# Patient Record
Sex: Male | Born: 1935 | Race: White | Hispanic: No | Marital: Married
Health system: Southern US, Community
[De-identification: ages and names within clinical notes are randomized; demographics above are authoritative.]

---

## 2006-08-19 ENCOUNTER — Ambulatory Visit: Payer: Self-pay | Admitting: Urology

## 2007-05-06 ENCOUNTER — Ambulatory Visit: Payer: Self-pay | Admitting: Surgery

## 2007-05-06 ENCOUNTER — Other Ambulatory Visit: Payer: Self-pay

## 2007-05-10 ENCOUNTER — Ambulatory Visit: Payer: Self-pay | Admitting: Surgery

## 2007-08-17 ENCOUNTER — Ambulatory Visit: Payer: Self-pay | Admitting: Urology

## 2009-04-04 ENCOUNTER — Encounter: Admission: RE | Admit: 2009-04-04 | Discharge: 2009-04-04 | Payer: Self-pay | Admitting: Family Medicine

## 2009-08-10 ENCOUNTER — Inpatient Hospital Stay (HOSPITAL_COMMUNITY): Admission: RE | Admit: 2009-08-10 | Discharge: 2009-08-14 | Payer: Self-pay | Admitting: Orthopaedic Surgery

## 2010-02-24 ENCOUNTER — Encounter: Payer: Self-pay | Admitting: Family Medicine

## 2010-04-21 LAB — GLUCOSE, CAPILLARY
Glucose-Capillary: 107 mg/dL — ABNORMAL HIGH (ref 70–99)
Glucose-Capillary: 118 mg/dL — ABNORMAL HIGH (ref 70–99)
Glucose-Capillary: 119 mg/dL — ABNORMAL HIGH (ref 70–99)
Glucose-Capillary: 133 mg/dL — ABNORMAL HIGH (ref 70–99)
Glucose-Capillary: 134 mg/dL — ABNORMAL HIGH (ref 70–99)
Glucose-Capillary: 138 mg/dL — ABNORMAL HIGH (ref 70–99)
Glucose-Capillary: 145 mg/dL — ABNORMAL HIGH (ref 70–99)
Glucose-Capillary: 148 mg/dL — ABNORMAL HIGH (ref 70–99)

## 2010-04-21 LAB — CBC
HCT: 24.6 % — ABNORMAL LOW (ref 39.0–52.0)
HCT: 26.3 % — ABNORMAL LOW (ref 39.0–52.0)
HCT: 30.8 % — ABNORMAL LOW (ref 39.0–52.0)
HCT: 40.7 % (ref 39.0–52.0)
Hemoglobin: 10.7 g/dL — ABNORMAL LOW (ref 13.0–17.0)
Hemoglobin: 8.6 g/dL — ABNORMAL LOW (ref 13.0–17.0)
Hemoglobin: 9.5 g/dL — ABNORMAL LOW (ref 13.0–17.0)
MCH: 31.3 pg (ref 26.0–34.0)
MCH: 31.4 pg (ref 26.0–34.0)
MCH: 32.3 pg (ref 26.0–34.0)
MCHC: 34.8 g/dL (ref 30.0–36.0)
MCHC: 35.4 g/dL (ref 30.0–36.0)
MCHC: 36 g/dL (ref 30.0–36.0)
MCV: 89.6 fL (ref 78.0–100.0)
MCV: 90.1 fL (ref 78.0–100.0)
MCV: 91.5 fL (ref 78.0–100.0)
Platelets: 102 10*3/uL — ABNORMAL LOW (ref 150–400)
Platelets: 120 10*3/uL — ABNORMAL LOW (ref 150–400)
Platelets: 94 10*3/uL — ABNORMAL LOW (ref 150–400)
RBC: 2.73 MIL/uL — ABNORMAL LOW (ref 4.22–5.81)
RBC: 3.41 MIL/uL — ABNORMAL LOW (ref 4.22–5.81)
RBC: 4.45 MIL/uL (ref 4.22–5.81)
RDW: 12.7 % (ref 11.5–15.5)
RDW: 13.9 % (ref 11.5–15.5)
WBC: 7.9 10*3/uL (ref 4.0–10.5)
WBC: 8.6 10*3/uL (ref 4.0–10.5)

## 2010-04-21 LAB — BASIC METABOLIC PANEL
BUN: 20 mg/dL (ref 6–23)
CO2: 27 mEq/L (ref 19–32)
CO2: 29 mEq/L (ref 19–32)
Calcium: 8 mg/dL — ABNORMAL LOW (ref 8.4–10.5)
Chloride: 106 mEq/L (ref 96–112)
Chloride: 108 mEq/L (ref 96–112)
Creatinine, Ser: 1.15 mg/dL (ref 0.4–1.5)
Creatinine, Ser: 1.23 mg/dL (ref 0.4–1.5)
GFR calc Af Amer: >60 mL/min (ref >60)
GFR calc Af Amer: >60 mL/min (ref >60)
GFR calc non Af Amer: >60 mL/min (ref >60)
Glucose, Bld: 133 mg/dL — ABNORMAL HIGH (ref 70–99)
Glucose, Bld: 137 mg/dL — ABNORMAL HIGH (ref 70–99)
Potassium: 3.6 mEq/L (ref 3.5–5.1)
Potassium: 3.9 mEq/L (ref 3.5–5.1)
Potassium: 4.3 mEq/L (ref 3.5–5.1)
Sodium: 138 mEq/L (ref 135–145)
Sodium: 141 mEq/L (ref 135–145)

## 2010-04-21 LAB — CROSSMATCH
ABO/RH(D): A POS
Antibody Screen: NEGATIVE

## 2010-04-21 LAB — PROTIME-INR
INR: 0.98 (ref 0.00–1.49)
INR: 1.12 (ref 0.00–1.49)
INR: 1.33 (ref 0.00–1.49)
Prothrombin Time: 12.9 seconds (ref 11.6–15.2)
Prothrombin Time: 14.3 seconds (ref 11.6–15.2)
Prothrombin Time: 16.4 seconds — ABNORMAL HIGH (ref 11.6–15.2)

## 2010-04-21 LAB — TYPE AND SCREEN: ABO/RH(D): A POS

## 2010-04-21 LAB — COMPREHENSIVE METABOLIC PANEL
Albumin: 3.7 g/dL (ref 3.5–5.2)
Alkaline Phosphatase: 43 U/L (ref 39–117)
BUN: 28 mg/dL — ABNORMAL HIGH (ref 6–23)
CO2: 26 mEq/L (ref 19–32)
Chloride: 110 mEq/L (ref 96–112)
Creatinine, Ser: 1.31 mg/dL (ref 0.4–1.5)
GFR calc non Af Amer: 52 mL/min — ABNORMAL LOW (ref >60)
Glucose, Bld: 139 mg/dL — ABNORMAL HIGH (ref 70–99)
Total Bilirubin: 0.7 mg/dL (ref 0.3–1.2)

## 2011-03-24 ENCOUNTER — Emergency Department: Payer: Self-pay | Admitting: Emergency Medicine

## 2011-03-24 LAB — BASIC METABOLIC PANEL
Anion Gap: 12 (ref 7–16)
Calcium, Total: 8.6 mg/dL (ref 8.5–10.1)
Chloride: 104 mmol/L (ref 98–107)
EGFR (Non-African Amer.): 56 — ABNORMAL LOW
Osmolality: 292 (ref 275–301)
Potassium: 4.1 mmol/L (ref 3.5–5.1)

## 2011-03-24 LAB — CBC
HCT: 46.2 % (ref 40.0–52.0)
HGB: 15.5 g/dL (ref 13.0–18.0)
MCH: 29.2 pg (ref 26.0–34.0)
MCHC: 33.6 g/dL (ref 32.0–36.0)
RBC: 5.32 10*6/uL (ref 4.40–5.90)

## 2011-05-02 ENCOUNTER — Emergency Department: Payer: Self-pay | Admitting: Emergency Medicine

## 2011-05-02 LAB — URINALYSIS, COMPLETE
Bacteria: NONE SEEN
Bilirubin,UR: NEGATIVE
Blood: NEGATIVE
Glucose,UR: NEGATIVE mg/dL
Ketone: NEGATIVE
Leukocyte Esterase: NEGATIVE
Nitrite: NEGATIVE
Ph: 7
Protein: 30
RBC,UR: 7 /HPF
Specific Gravity: 1.016
Squamous Epithelial: <1
WBC UR: <1 /HPF

## 2011-05-02 LAB — PROTIME-INR
INR: 0.9
Prothrombin Time: 12.9 s

## 2011-05-02 LAB — CBC
HCT: 45.1 %
HGB: 15 g/dL
MCH: 28.9 pg
MCHC: 33.2 g/dL
MCV: 87 fL
Platelet: 124 x10 3/mm 3 — ABNORMAL LOW
RBC: 5.17 x10 6/mm 3
RDW: 14.4 %
WBC: 9.1 x10 3/mm 3

## 2011-05-02 LAB — COMPREHENSIVE METABOLIC PANEL
Albumin: 3.8 g/dL (ref 3.4–5.0)
Bilirubin,Total: 0.8 mg/dL (ref 0.2–1.0)
Calcium, Total: 8.8 mg/dL (ref 8.5–10.1)
Creatinine: 1.2 mg/dL (ref 0.60–1.30)
EGFR (African American): >60
Glucose: 143 mg/dL — ABNORMAL HIGH (ref 65–99)
SGPT (ALT): 23 U/L

## 2011-07-06 ENCOUNTER — Observation Stay: Payer: Self-pay | Admitting: Internal Medicine

## 2011-07-06 LAB — DIFFERENTIAL
Eosinophil #: 0.1 10*3/uL (ref 0.0–0.7)
Lymphocyte #: 2 10*3/uL (ref 1.0–3.6)
Neutrophil #: 5.5 10*3/uL (ref 1.4–6.5)
Neutrophil %: 63.1 %

## 2011-07-06 LAB — TROPONIN I: Troponin-I: <0.02 ng/mL

## 2011-07-06 LAB — TSH: Thyroid Stimulating Horm: 0.081 u[IU]/mL — ABNORMAL LOW

## 2011-07-06 LAB — COMPREHENSIVE METABOLIC PANEL
Albumin: 3.1 g/dL — ABNORMAL LOW (ref 3.4–5.0)
BUN: 16 mg/dL (ref 7–18)
Chloride: 110 mmol/L — ABNORMAL HIGH (ref 98–107)
Creatinine: 1.1 mg/dL (ref 0.60–1.30)
SGOT(AST): 18 U/L (ref 15–37)
Total Protein: 7.1 g/dL (ref 6.4–8.2)

## 2011-07-07 LAB — CBC WITH DIFFERENTIAL/PLATELET
Basophil #: 0 10*3/uL (ref 0.0–0.1)
Eosinophil %: 1.6 %
HCT: 33.5 % — ABNORMAL LOW (ref 40.0–52.0)
HGB: 11.4 g/dL — ABNORMAL LOW (ref 13.0–18.0)
Lymphocyte %: 39 %
MCH: 29.3 pg (ref 26.0–34.0)
MCV: 86 fL (ref 80–100)
Monocyte %: 14 %
RBC: 3.89 10*6/uL — ABNORMAL LOW (ref 4.40–5.90)

## 2011-07-07 LAB — BASIC METABOLIC PANEL
Chloride: 110 mmol/L — ABNORMAL HIGH (ref 98–107)
Co2: 23 mmol/L (ref 21–32)
Creatinine: 1.04 mg/dL (ref 0.60–1.30)
EGFR (African American): >60

## 2011-07-07 LAB — TROPONIN I: Troponin-I: <0.02 ng/mL

## 2011-08-04 DEATH — deceased

## 2012-12-04 IMAGING — CT CT HEAD WITHOUT CONTRAST
2 series · 15 of 30 positions shown, 19 images · non-contrast
Comparison: none

REASON FOR EXAM: ams
COMMENTS:

PROCEDURE:     CT  - CT HEAD WITHOUT CONTRAST  - July 06, 2011  [DATE]
RESULT:     Comparison is made to a prior study dated 05/02/2011.
TECHNIQUE: Helical noncontrast 5 mm sections were obtained from the skull
base to the vertex.

[Series 2: without · axial · non-contrast · 0.44mm/px · z∈[+186,+306]mm · 13 of 29 slices shown, 17 images]
[im 3/29  brain]
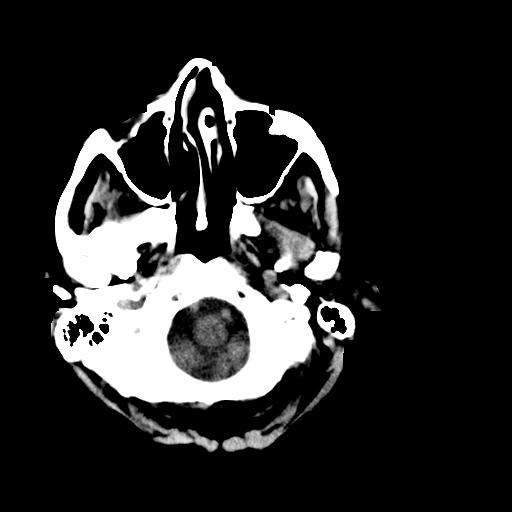
[im 3/29  bone]
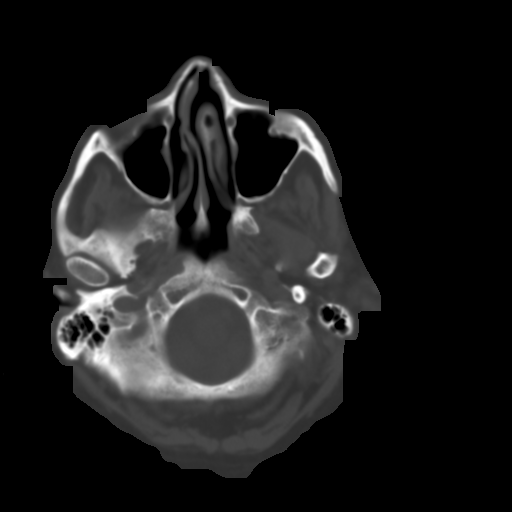
[im 5/29  brain]
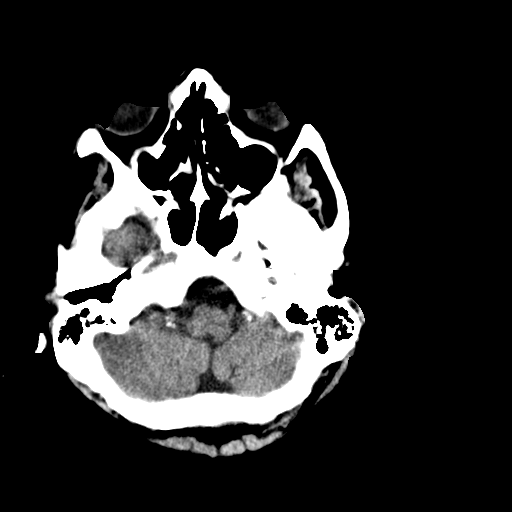
[im 7/29  brain]
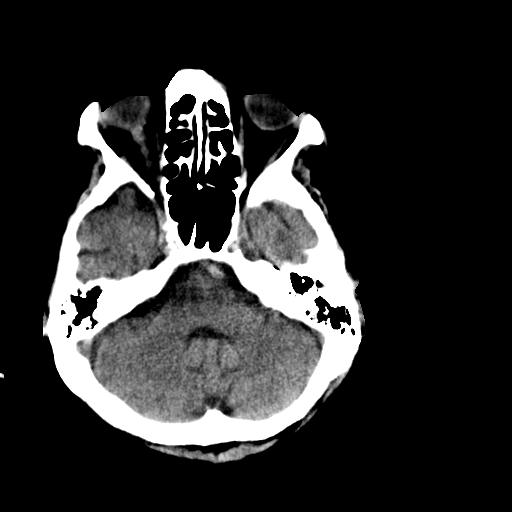
[im 9/29  brain]
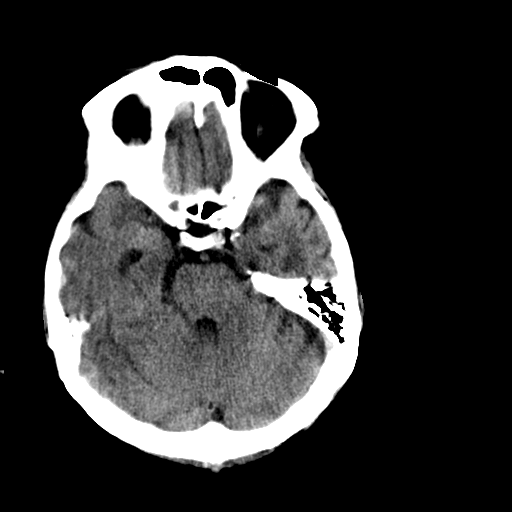
[im 11/29  brain]
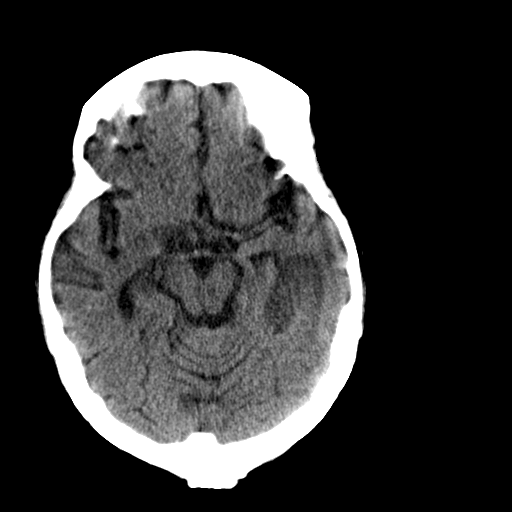
[im 11/29  bone]
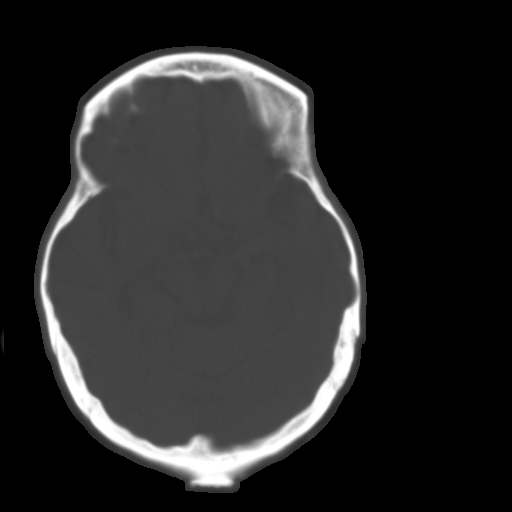
[im 13/29  brain]
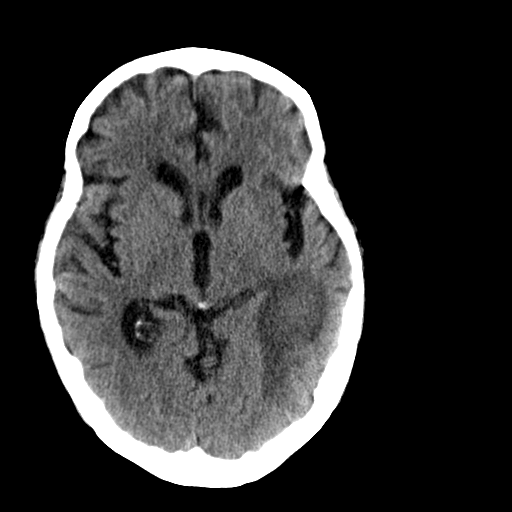
[im 15/29  brain]
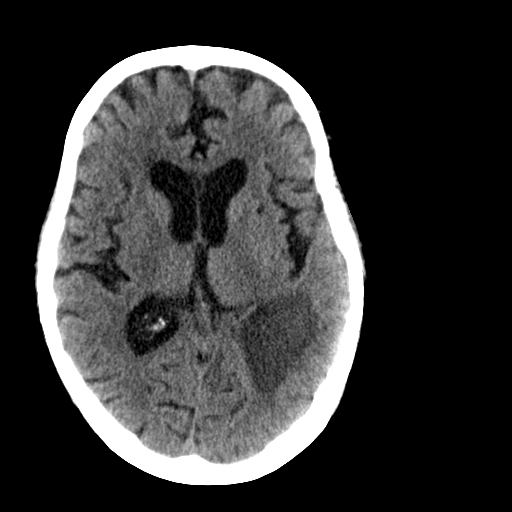
[im 17/29  brain]
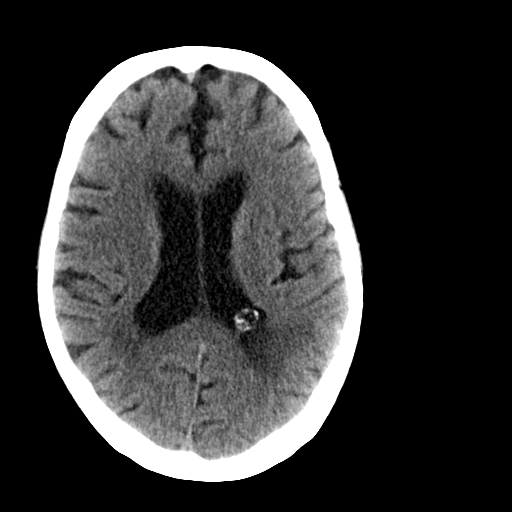
[im 19/29  brain]
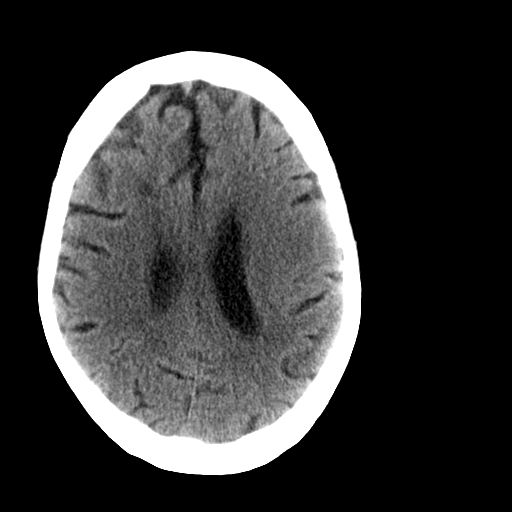
[im 19/29  bone]
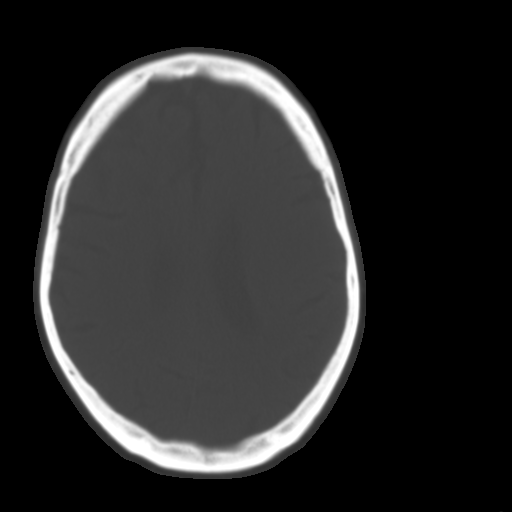
[im 21/29  brain]
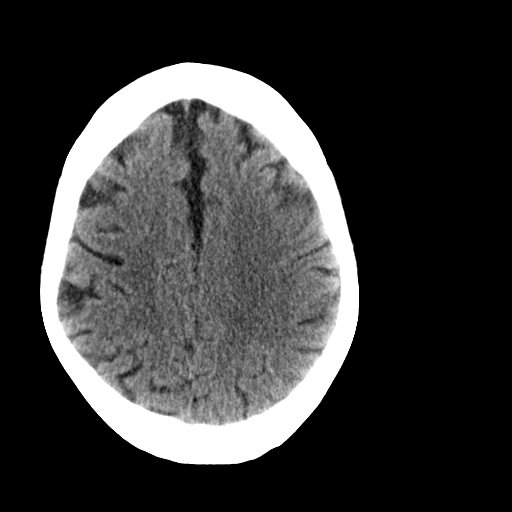
[im 23/29  brain]
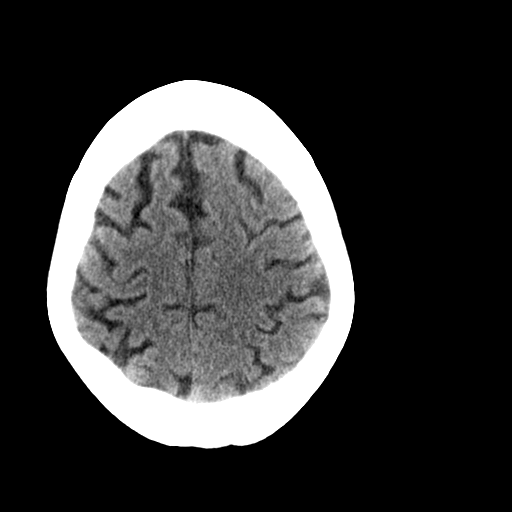
[im 25/29  brain]
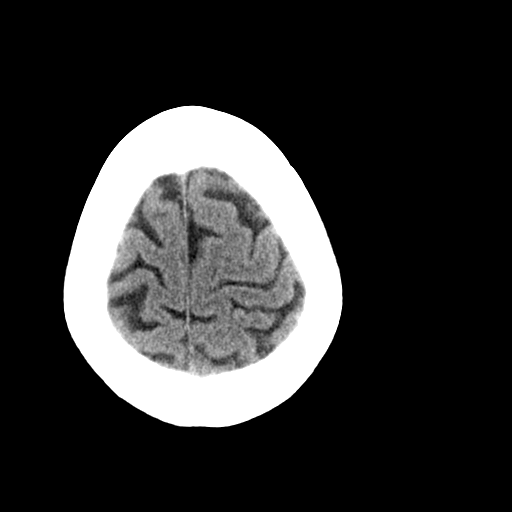
[im 27/29  brain]
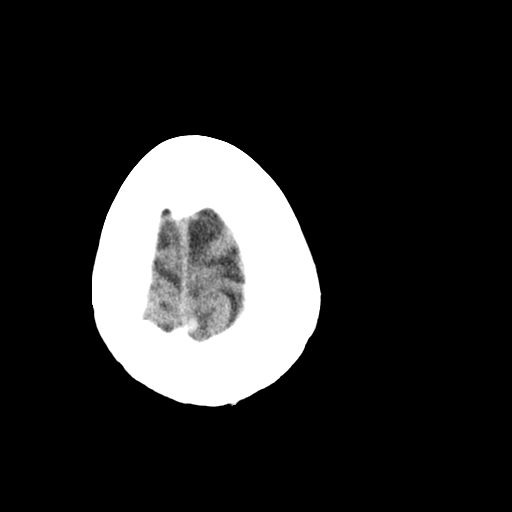
[im 27/29  bone]
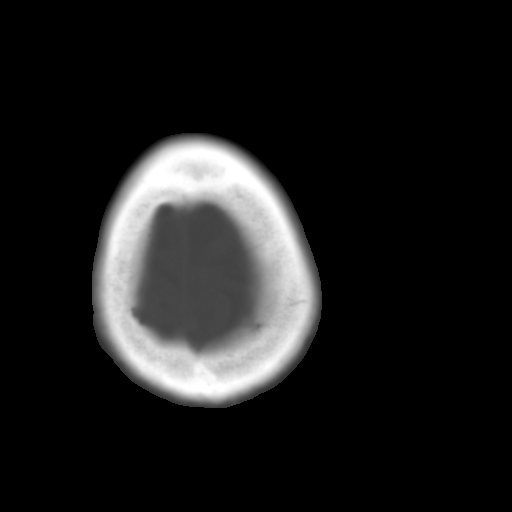

[Series 3: bone · axial · 0.44mm/px · z∈[+186,+206]mm · 2 of 29 slices shown]
[im 3/29  bone]
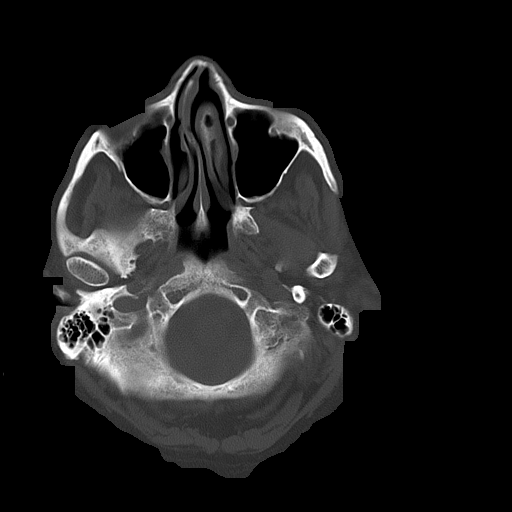
[im 7/29  bone]
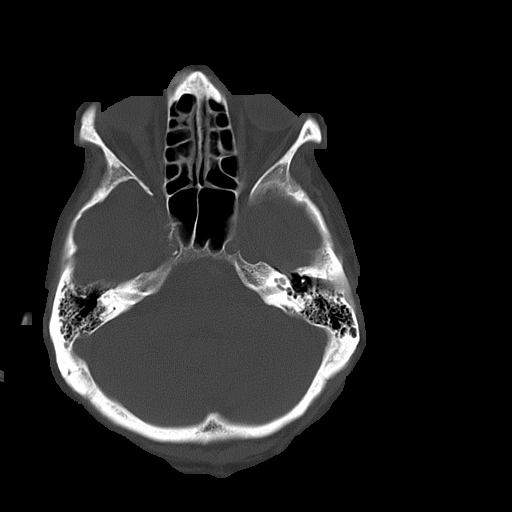

[15 of 30 positions shown; findings below may reference images not displayed]

FINDINGS: A focal area of oval-shaped, low attenuation projects within the
left temporal-occipital region. This is in an area of prior intraparenchymal
hemorrhage and has the appearance of a hematoma. No further intra-axial or
extra-axial fluid collections are identified or further evidence of acute
hemorrhage. There is no evidence of mass effect. The ventricles and cisterns
are patent. There is diffuse cortical atrophy as well as diffuse areas of
low attenuation within the subcortical, deep, and periventricular white
matter regions. There is no evidence of a depressed skull fracture. The
paranasal sinuses, visualized portions, and mastoid air cells are patent.
IMPRESSION: 1.  Findings consistent with an involuting intraparenchymal hemorrhage.
There is no evidence of acute abnormalities.
2.  Involutional and chronic changes as described above.

## 2014-05-28 NOTE — Discharge Summary (Signed)
PATIENT NAME:  Clinton Stevens, Clinton Stevens MR#:  [id number] DATE OF BIRTH:  August 17, 1922  DATE OF ADMISSION:  07/06/2011 DATE OF DISCHARGE:  07/07/2011  DISCHARGE DIAGNOSIS: Adult failure to thrive with worsening generalized weakness.   SECONDARY DIAGNOSES:  1. History of intracranial hemorrhage.  2. Hypertension. 3. Hyperlipidemia.  4. Diabetes mellitus. 5. Hypothyroidism.  6. Gout.  7. Dementia.  8. Kidney stones.   CONSULTANTS:  1. Speech therapy.  2. Physical therapy.  3. Care Management for hospice screening.   PROCEDURES/RADIOLOGY: CT scan of the head without contrast on 07/06/2011 showed involutional and chronic changes. No evidence of acute abnormalities.   KUB on 07/06/2011 showed nonobstructive bowel gas pattern.   Chest x-ray on 07/06/2011 showed no acute cardiopulmonary disease.   LABORATORY PANEL: Serum prealbumin was low with a value of 15.   HISTORY AND SHORT HOSPITAL COURSE: The patient is an 81 year old male with the above-mentioned medical problems who was admitted for altered mental status thought to be metabolic encephalopathy, likely multifactorial. He was also found to have adult failure to thrive. Please see Dr. Belva Chimesima Vaickute's dictated history and physical for further details. He was having real difficulty getting around and family could not take care of him for which hospice home screening was placed and he was accepted for hospice home as family was also in agreement and a bed was available. He was transferred to the hospice home today. He had negative two sets of cardiac enzymes and was discharged in fair condition.   DISCHARGE PHYSICAL EXAMINATION:   VITAL SIGNS: On the date of discharge, his temperature was 97.7, heart rate 121 per minute, respirations 18 per minute, blood pressure 121/76 mmHg, and he was saturating 96% on room air.   HEART: S1 and S2 normal. No murmurs, rubs, or gallops.   LUNGS: Clear to auscultation bilaterally. No wheezing, rales, rhonchi, or  crepitation.   ABDOMEN: Soft, benign.   NEUROLOGIC: He had significant weakness all over his body. He was quite lethargic and confused.   All other physical examination remained at baseline.   DISCHARGE MEDICATIONS:  1. Roxanol 0.25 to 0.5 mL p.o./sublingual every 1 to 2 hours as needed. 2. Lorazepam 0.5 mg p.o./sublingual every 2 to 4 hours as needed.  3. Ranitidine 150 mg p.o. twice a day. 4. ABHR suppository one per rectum every 4 to 6 hours as needed.  5. Synthroid 100 mcg p.o. daily.  DISCHARGE DIET: As tolerated.   DISCHARGE ACTIVITY: As tolerated.   DISCHARGE INSTRUCTIONS AND FOLLOW-UP: The patient was instructed to use medication crushed or liquid when appropriate. May change to rectal route if unable to swallow. Oxygen 2 liters by nasal cannula continuous and/or as needed. Foley catheter to be left      indwelling for urinary incontinence and for prevention of skin breakdown. He will need followup with his primary care physician in 1 to 2 weeks.   TOTAL TIME DISCHARGING PATIENT: 45 minutes. ____________________________ Ellamae SiaVipul S. Sherryll BurgerShah, MD vss:slb D: 07/07/2011 22:21:53 ET T: 07/08/2011 11:14:32 ET JOB#: [id number]  cc: Kabella Cassidy S. Sherryll BurgerShah, MD, <Dictator> Jillene Bucksenny C. Arlana Pouchate, MD Ellamae SiaVIPUL S [facility] MD ELECTRONICALLY SIGNED 07/09/2011 13:56

## 2014-05-28 NOTE — H&P (Signed)
PATIENT NAME:  Clinton Stevens, Clinton Stevens MR#:  [id number] DATE OF BIRTH:  [birth year]  DATE OF ADMISSION:  07/06/2011  PRIMARY CARE PHYSICIAN: Dewaine Oats, MD  HISTORY OF PRESENT ILLNESS:  The patient is an 81 year old Caucasian male with past medical history significant for history of intracranial hemorrhage diagnosed in 04/2011, history of hypertension, hyperlipidemia, diabetes mellitus, and hypothyroidism who presented to the hospital with the patient's family's complaints of acute onset of altered mental status. According to the patient's family, the patient has been having progressive weakness since last night. He had worsening weakness. Initially after he was discharged in 04/2011 from the hospital after intracranial hemorrhage he was able to walk with a walker. However, now he is not able to walk even with a walker, not even attempting to walk. He was not able to answer questions. He was nonverbal. He has had progressively slurring speech and the patient's family was not able to take care of him.  He apparently fell out of a chair and his wife was not able to get him up. EMS was called and he was brought to the Emergency Room for further evaluation. Urinalysis was attempted, however, they were not able to access with Foley catheter. Bladder scan showed only 100 mL and so no further attempts were made. The patient had not been eating for the past one and a half days and he looks dehydrated. The patient's family requested admission as well as possibly placement.   PAST MEDICAL HISTORY:  1. History of intracranial hemorrhage diagnosed in 04/2011.  2. History of left hip replacement approximately two years ago.  3. History of hypertension. He was on medications in the past. Now he is off medications.  4. Hyperlipidemia.  5. Diabetes mellitus.  6. Hypothyroidism.  7. Kidney stones.  8. Gout. 9. Dementia. 10. Metal plate in his head for unknown reason. 11. History of left inguinal hernia repair in  05/2007.  ALLERGIES: None.   FAMILY HISTORY: Hypertension, cerebrovascular accident, diabetes mellitus, lung cancer in smokers. No early coronary artery disease.   SOCIAL HISTORY: The patient is married. He has one child. The patient's wife has three children, a total of four children. Smoking in the past, however quit 35 years ago. No alcohol abuse. He was a Chief Strategy Officer.   REVIEW OF SYSTEMS: Not available. The patient has slurred speech and is very confused and not able to answer questions.  PHYSICAL EXAMINATION: VITAL SIGNS: Temperature 98.3, pulse 115, respiration rate 18, blood pressure 138/78, saturation 98% on room air.   GENERAL: This is a well-developed, well-nourished Caucasian male in no significant distress, comfortable on the stretcher.  HEENT:  Pupils equal, round, reactive to light. Extraocular movements intact. No icterus or conjunctivitis. Normal hearing. No pharyngeal erythema. Mucosa is dry.   NECK: No masses, nontender. Thyroid is not enlarged. No adenopathy. No JVD or carotid bruits bilaterally. Full range of motion.   LUNGS: Clear to auscultation anteriorly. No rales, rhonchi, diminished breath sounds, or wheezing. No labored inspirations, increased effort, or dullness to percussion, not in overt respiratory distress.   CARDIOVASCULAR: S1, S2 appreciated. No murmurs, gallops, or rubs noted. Rhythm was regular, tachycardic. PMI not lateralized. Chest is nontender to palpation.   EXTREMITIES: 1+ pedal pulses. No lower extremity edema, calf tenderness, or cyanosis noted.   ABDOMEN: Soft, nontender. Bowel sounds present. No hepatosplenomegaly or masses were noted.   RECTAL: Deferred.   MUSCULOSKELETAL: The patient is able to move upper extremities. Not able to assess  his lower extremity movement. He is noncompliant and not cooperative. No cyanosis. He is not able to sit up. Gait is not tested.   SKIN: Skin did not reveal any rashes, lesions,  erythema, nodularity, or induration. It was warm and dry to palpation.   LYMPH: No adenopathy in the cervical region.   NEUROLOGICAL: Cranial nerves grossly intact. Not able to assess his sensory. The patient does have dysarthria.   PSYCH: The patient is alert, not cooperative, confused, but no significant agitation or depression noted.   LABORATORY, DIAGNOSTIC, AND RADIOLOGICAL DATA: BMP showed sodium level of 146 and glucose 107, otherwise unremarkable study. Liver enzymes showed albumin level of 3.1, otherwise unremarkable. The patient's white blood cell count is normal at 8.7, hemoglobin 12.3, platelet count 153. Urinalysis is pending. EKG showed sinus tachy at 115 beats per minute. Normal axis, no acute ST-T changes were noted.   Chest x-ray one view showed no acute cardiopulmonary disease as compared to prior study. KUB revealed nonobstructive bowel gas pattern.   ASSESSMENT AND PLAN:  1. Altered mental status: Admit the patient to the medical floor. Rule out infection. Get urinalysis and start antibiotic therapy if urinalysis is abnormal. Also get CT scan of the head to rule out recurrent bleed. Get physical therapist involved to assess his ability to ambulate and get him discharged with the help of social workers to rehab versus Hospice inpatient. We will continue Risperdal for now.  2. Hypernatremia, likely dehydration related: We will start patient on D5 water.  3. Diabetes mellitus: Continue patient on sliding scale insulin.  4. Failure to thrive, adult, questionable malnutrition: Get prealbumin, get dietary consultation for further recommendations. Start patient on dysphagia diet. Get speech involved as well. 5. Anemia: Guaiac.  6. History of hypertension: The patient's blood pressure seems to be reasonable.  7. Hyperlipidemia: Continue outpatient medications.  8. Dementia: Supportive therapy. Physical therapy.  9. Hypothyroidism: Continue Synthroid. Get TSH.  10. History of  constipation: Continue Colace, advance doses.   TIME SPENT: 50 minutes.   ____________________________ Katharina Caperima Jaicion Laurie, MD rv:bjt D: 07/06/2011 16:17:59 ET T: 07/07/2011 07:16:23 ET JOB#: [id number]  cc: Katharina Caperima Kavan Devan, MD, <Dictator> Jillene Bucksenny C. Arlana Pouchate, MD Katharina CaperIMA Collene Massimino MD ELECTRONICALLY SIGNED 07/08/2011 19:05
